# Patient Record
Sex: Male | Born: 1960 | Race: White | Hispanic: No | Marital: Married | Smoking: Never smoker
Health system: Southern US, Community
[De-identification: ages and names within clinical notes are randomized; demographics above are authoritative.]

## PROBLEM LIST (undated history)

## (undated) DIAGNOSIS — E785 Hyperlipidemia, unspecified: Secondary | ICD-10-CM

## (undated) DIAGNOSIS — T7840XA Allergy, unspecified, initial encounter: Secondary | ICD-10-CM

## (undated) HISTORY — DX: Allergy, unspecified, initial encounter: T78.40XA

## (undated) HISTORY — DX: Hyperlipidemia, unspecified: E78.5

## (undated) HISTORY — PX: APPENDECTOMY: SHX54

## (undated) HISTORY — PX: HERNIA REPAIR: SHX51

---

## 2001-04-14 ENCOUNTER — Emergency Department (HOSPITAL_COMMUNITY): Admission: EM | Admit: 2001-04-14 | Discharge: 2001-04-14 | Payer: Self-pay

## 2015-12-08 ENCOUNTER — Ambulatory Visit (INDEPENDENT_AMBULATORY_CARE_PROVIDER_SITE_OTHER): Payer: Worker's Compensation | Admitting: Emergency Medicine

## 2015-12-08 ENCOUNTER — Ambulatory Visit: Payer: Worker's Compensation

## 2015-12-08 VITALS — BP 144/82 | HR 87 | Temp 97.9°F | Resp 16 | Ht 62.0 in | Wt 267.0 lb

## 2015-12-08 DIAGNOSIS — S8001XA Contusion of right knee, initial encounter: Secondary | ICD-10-CM

## 2015-12-08 DIAGNOSIS — S8002XA Contusion of left knee, initial encounter: Secondary | ICD-10-CM | POA: Diagnosis not present

## 2015-12-08 DIAGNOSIS — Z23 Encounter for immunization: Secondary | ICD-10-CM

## 2015-12-08 DIAGNOSIS — S8012XA Contusion of left lower leg, initial encounter: Secondary | ICD-10-CM

## 2015-12-08 DIAGNOSIS — S5002XA Contusion of left elbow, initial encounter: Secondary | ICD-10-CM

## 2015-12-08 DIAGNOSIS — S46912A Strain of unspecified muscle, fascia and tendon at shoulder and upper arm level, left arm, initial encounter: Secondary | ICD-10-CM | POA: Diagnosis not present

## 2015-12-08 NOTE — Progress Notes (Signed)
By signing my name below, I, Mesha Guinyard, attest that this documentation has been prepared under the direction and in the presence of Lesle ChrisSteven Ervan Heber, MD.  Electronically Signed: Arvilla MarketMesha Guinyard, Medical Scribe. 12/08/2015. 11:14 AM.  Chief Complaint:  Chief Complaint  Patient presents with  . Knee Injury    Larey SeatFell at work on 4/18 - pain in both knee   . Shoulder Pain    HPI: Martin Schwartz is a 55 y.o. male who [facility] complaining of w/c injury that resulted in knee and shoulder pain which occurred 3 days ago while doing an instillation on a truck. He tripped and fell on a plastic bedliner, which was sitting on concrete. During the fall, he put most of his weight on his left knee and right elbow. He reports an abrasion on his left ankle, and is a little sore to palpation.   Past Medical History  Diagnosis Date  . Allergy   . Hyperlipidemia    Past Surgical History  Procedure Laterality Date  . Appendectomy    . Hernia repair     Social History   Social History  . Marital Status: Married    Spouse Name: N/A  . Number of Children: N/A  . Years of Education: N/A   Social History Main Topics  . Smoking status: Never Smoker   . Smokeless tobacco: Not on file  . Alcohol Use: 3.6 oz/week    6 Standard drinks or equivalent per week  . Drug Use: No  . Sexual Activity: Not on file   Other Topics Concern  . Not on file   Social History Narrative  . No narrative on file   No family history on file. Allergies  Allergen Reactions  . Bee Venom    Prior to Admission medications   Medication Sig Start Date End Date Taking? Authorizing Provider  lisinopril-hydrochlorothiazide (PRINZIDE,ZESTORETIC) 20-12.5 MG tablet Take 1 tablet by mouth daily.   Yes Historical Provider, MD  pravastatin (PRAVACHOL) 40 MG tablet Take 40 mg by mouth daily.   Yes Historical Provider, MD  sertraline (ZOLOFT) 100 MG tablet Take 100 mg by mouth daily.   Yes Historical Provider, MD      ROS: The patient denies fevers, chills, night sweats, unintentional weight loss, chest pain, palpitations, wheezing, dyspnea on exertion, nausea, vomiting, abdominal pain, dysuria, hematuria, melena, numbness, weakness, or tingling.  All other systems have been reviewed and were otherwise negative with the exception of those mentioned in the HPI and as above.    PHYSICAL EXAM: Filed Vitals:   12/08/15 1105  BP: 144/82  Pulse: 87  Temp: 97.9 F (36.6 C)  Resp: 16   Body mass index is 48.82 kg/(m^2).   General: Alert, no acute distress HEENT:  Normocephalic, atraumatic, oropharynx patent. Eye: Nonie HoyerOMI, [facility] Cardiovascular:  Regular rate and rhythm, no rubs murmurs or gallops.  No Carotid bruits, radial pulse intact. No pedal edema.  Respiratory: Clear to auscultation bilaterally.  No wheezes, rales, or rhonchi.  No cyanosis, no use of accessory musculature Abdominal: No organomegaly, abdomen is soft and non-tender, positive bowel sounds.  No masses. Musculoskeletal: Gait intact. No edema. Unable to fully abduct left arm. Pain with internal and external rotation at the shoulder. Elbow has full flexion and extension without swelling. Left knee is stable without ecchymosis or instability. Abrasion over left lower leg. Full flexion and extension over right knee. No instability. Skin: No rashes. Neurologic: Facial musculature symmetric. Psychiatric: Patient acts appropriately  throughout our interaction. Lymphatic: No cervical or submandibular lymphadenopathy  LABS:   EKG/XRAY:   Primary read interpreted by Dr. Cleta Alberts at [facility].    Dg Tibia/fibula Left  12/08/2015  CLINICAL DATA:  Fall 3 days ago.  Left knee contusion. EXAM: LEFT TIBIA AND FIBULA - 2 VIEW COMPARISON:  None. FINDINGS: There is no evidence of fracture or other focal bone lesions. Soft tissues are unremarkable. IMPRESSION: Negative. Electronically Signed   By: Charlett Nose M.D.   On: 12/08/2015 12:13   Dg Shoulder  Left  12/08/2015  CLINICAL DATA:  Left shoulder injury from fall. EXAM: LEFT SHOULDER - 2+ VIEW COMPARISON:  None. FINDINGS: There is no evidence of fracture or dislocation. There is no evidence of arthropathy or other focal bone abnormality. Soft tissues are unremarkable. IMPRESSION: Negative. Electronically Signed   By: Charlett Nose M.D.   On: 12/08/2015 12:15   Dg Knee Complete 4 Views Left  12/08/2015  CLINICAL DATA:  Knee contusion. Injury 3 days ago. Tripped and fell. EXAM: LEFT KNEE - COMPLETE 4+ VIEW COMPARISON:  Right knee performed today. FINDINGS: There is no evidence of fracture, dislocation, or joint effusion. There is no evidence of arthropathy or other focal bone abnormality. Soft tissues are unremarkable. IMPRESSION: Negative. Electronically Signed   By: Charlett Nose M.D.   On: 12/08/2015 12:12   Dg Knee Complete 4 Views Right  12/08/2015  CLINICAL DATA:  Fall 3 days ago.  Knee contusion. EXAM: RIGHT KNEE - COMPLETE 4+ VIEW COMPARISON:  Left knee performed today FINDINGS: There is no evidence of fracture, dislocation, or joint effusion. There is no evidence of arthropathy or other focal bone abnormality. Soft tissues are unremarkable. IMPRESSION: Negative. Electronically Signed   By: Charlett Nose M.D.   On: 12/08/2015 12:13      ASSESSMENT/PLAN: 1.patient to have limited activity. Advised him he could take some ibuprofen or Aleve for discomfort. Recheck 1 week and be sure her symptoms are resolving. He has had chronic problems with his left shoulder and should probably go ahead and see an orthopedist regarding this.I personally performed the services described in this documentation, which was scribed in my presence. The recorded information has been reviewed and is accurate. I will see the patient back here in one week.   Gross sideeffects, risk and benefits, and alternatives of medications d/w patient. Patient is aware that all medications have potential sideeffects and we are unable  to predict every sideeffect or drug-drug interaction that may occur.  Lesle Chris MD 12/08/2015 11:14 AM

## 2015-12-08 NOTE — Patient Instructions (Addendum)
Please take 1-2 Aleve twice a day with food. Avoid any heavy lifting or straining. Recheck in our office in one week.    IF you received an x-Jarrah today, you will receive an invoice from [facility]. Please contact [facility] at [phone] with questions or concerns regarding your invoice.   IF you received labwork today, you will receive an invoice from [facility]. Please contact [facility] at [phone] with questions or concerns regarding your invoice.   Our billing staff will not be able to assist you with questions regarding bills from these companies.  You will be contacted with the lab results as soon as they are available. The fastest way to get your results is to activate your My Chart account. Instructions are located on the last page of this paperwork. If you have not heard from us regarding the results in 2 weeks, please contact this office.

## 2015-12-16 ENCOUNTER — Ambulatory Visit (INDEPENDENT_AMBULATORY_CARE_PROVIDER_SITE_OTHER): Payer: Worker's Compensation | Admitting: Emergency Medicine

## 2015-12-16 VITALS — BP 130/74 | HR 77 | Temp 98.2°F | Resp 16 | Ht 70.0 in | Wt 266.8 lb

## 2015-12-16 DIAGNOSIS — S46912D Strain of unspecified muscle, fascia and tendon at shoulder and upper arm level, left arm, subsequent encounter: Secondary | ICD-10-CM | POA: Diagnosis not present

## 2015-12-16 MED ORDER — HYDROCODONE-ACETAMINOPHEN 10-325 MG PO TABS: 1.0000 | ORAL_TABLET | Freq: Three times a day (TID) | ORAL | Status: DC | PRN

## 2015-12-16 MED ORDER — MELOXICAM 15 MG PO TABS: 15.0000 mg | ORAL_TABLET | Freq: Every day | ORAL | Status: DC

## 2015-12-16 MED ORDER — HYDROCODONE-ACETAMINOPHEN 5-325 MG PO TABS: ORAL_TABLET | ORAL | Status: AC

## 2015-12-16 MED ORDER — MELOXICAM 15 MG PO TABS: 15.0000 mg | ORAL_TABLET | Freq: Every day | ORAL | Status: AC

## 2015-12-16 NOTE — Patient Instructions (Addendum)
No lifting over 20 pounds. No overhead lifting. No work with arms above shoulder height.    IF you received an x-Daegon today, you will receive an invoice from [facility]. Please contact [facility] at [phone] with questions or concerns regarding your invoice.   IF you received labwork today, you will receive an invoice from [facility]. Please contact [facility] at [phone] with questions or concerns regarding your invoice.   Our billing staff will not be able to assist you with questions regarding bills from these companies.  You will be contacted with the lab results as soon as they are available. The fastest way to get your results is to activate your My Chart account. Instructions are located on the last page of this paperwork. If you have not heard from us regarding the results in 2 weeks, please contact this office.

## 2015-12-16 NOTE — Progress Notes (Signed)
By signing my name below, I, Stann Oresung-Kai Tsai, attest that this documentation has been prepared under the direction and in the presence of Lesle ChrisSteven Sylvain Hasten, MD. Electronically Signed: Stann Oresung-Kai Tsai, Scribe. 12/16/2015 , 10:28 AM .  Patient was seen in room 2 .  Chief Complaint:  Chief Complaint  Patient presents with  . Follow-up  . Knee pain    "better"  . left shoulder    "still Hurting"    HPI: Martin Schwartz is a 55 y.o. male who [facility] for follow up from an injury that occurred at work. Date of injury 12/05/15. He was initially seen on 12/08/15 for left shoulder pain and bilateral knee pain. Xrays of his knees, left shoulder and tibia fibula were negative. He also mentioned having chronic left shoulder pain, and the fall aggravated the left shoulder pain. He was advised to take ibuprofen or Aleve for discomfort. Here for 1 week recheck.   Patient reports knees feeling better but can't kneel down on them.  His shoulder, however, still has constant soreness (rating 7-8/10) with some weakness. He notices this pain even when he's sleeping. He notes picking up something heavy while at work years ago and felt a pop. He's seen an orthopedist in the past; was informed he had inflammation and received cortisone injection without any improvement. He's been taking ibuprofen for the pain.   Allergies  Allergen Reactions  . Bee Venom    Prior to Admission medications   Medication Sig Start Date End Date Taking? Authorizing Provider  lisinopril-hydrochlorothiazide (PRINZIDE,ZESTORETIC) 20-12.5 MG tablet Take 1 tablet by mouth daily.   Yes Historical Provider, MD  pravastatin (PRAVACHOL) 40 MG tablet Take 40 mg by mouth daily.   Yes Historical Provider, MD  sertraline (ZOLOFT) 100 MG tablet Take 100 mg by mouth daily.   Yes Historical Provider, MD     ROS:  Constitutional: negative for fever, chills, night sweats, weight changes, or fatigue  HEENT: negative for vision changes,  hearing loss, congestion, rhinorrhea, ST, epistaxis, or sinus pressure Cardiovascular: negative for chest pain or palpitations Respiratory: negative for hemoptysis, wheezing, shortness of breath, or cough Abdominal: negative for abdominal pain, nausea, vomiting, diarrhea, or constipation Dermatological: negative for rash Musc: positive for left shoulder pain, bilateral knee pain Neurologic: negative for headache, dizziness, or syncope All other systems reviewed and are otherwise negative with the exception to those above and in the HPI.  PHYSICAL EXAM: Filed Vitals:   12/16/15 0957  BP: 130/74  Pulse: 77  Temp: 98.2 F (36.8 C)  Resp: 16   Body mass index is 38.28 kg/(m^2).   General: Alert, no acute distress HEENT:  Normocephalic, atraumatic, oropharynx patent. Eye: Non[facility]OMI[facility] Cardiovascular:  Regular rate and rhythm, no rubs murmurs or gallops.  No Carotid bruits, radial pulse intact. No pedal edema.  Respiratory: Clear to auscultation bilaterally.  No wheezes, rales, or rhonchi.  No cyanosis, no use of accessory musculature Abdominal: No organomegaly, abdomen is soft and non-tender, positive bowel sounds. No masses. Musculoskeletal: Left shoulder, AC joint: tenderness over the left AC joint, pain with abduction past 90 degrees, pain with external rotation against resistance; Knees: no swelling, no tenderness, good rom, no instability Skin: No rashes. Neurologic: Facial musculature symmetric. Psychiatric: Patient acts appropriately throughout our interaction.  Lymphatic: No cervical or submandibular lymphadenopathy Genitourinary/Anorectal: No acute findings  LABS:   EKG/XRAY:     ASSESSMENT/PLAN: Patient has evidence 7 ACC joint problem in the left shoulder. He had  had some shoulder difficulties prior to his fall but it appears the fall has significantly aggravated his shoulder discomfort. I think an opinion from an orthopedist would be indicated. He will be on Mobitz  one in the morning and have hydrocodone at night for pain. He will do some stretching exercises. He will apply heat prior to exercises and after work well, ice to the area.I personally performed the services described in this documentation, which was scribed in my presence. The recorded information has been reviewed and is accurate.   Gross sideeffects, risk and benefits, and alternatives of medications d/w patient. Patient is aware that all medications have potential sideeffects and we are unable to predict every sideeffect or drug-drug interaction that may occur.  Lesle Chris MD 12/16/2015 10:28 AM

## 2016-10-17 IMAGING — CR DG KNEE COMPLETE 4+V*L*
4 series · 4 of 4 positions shown · non-contrast
Comparison: Right knee performed today.

CLINICAL DATA: Knee contusion. Injury 3 days ago. Tripped and fell.

EXAM:
LEFT KNEE - COMPLETE 4+ VIEW

[AP]
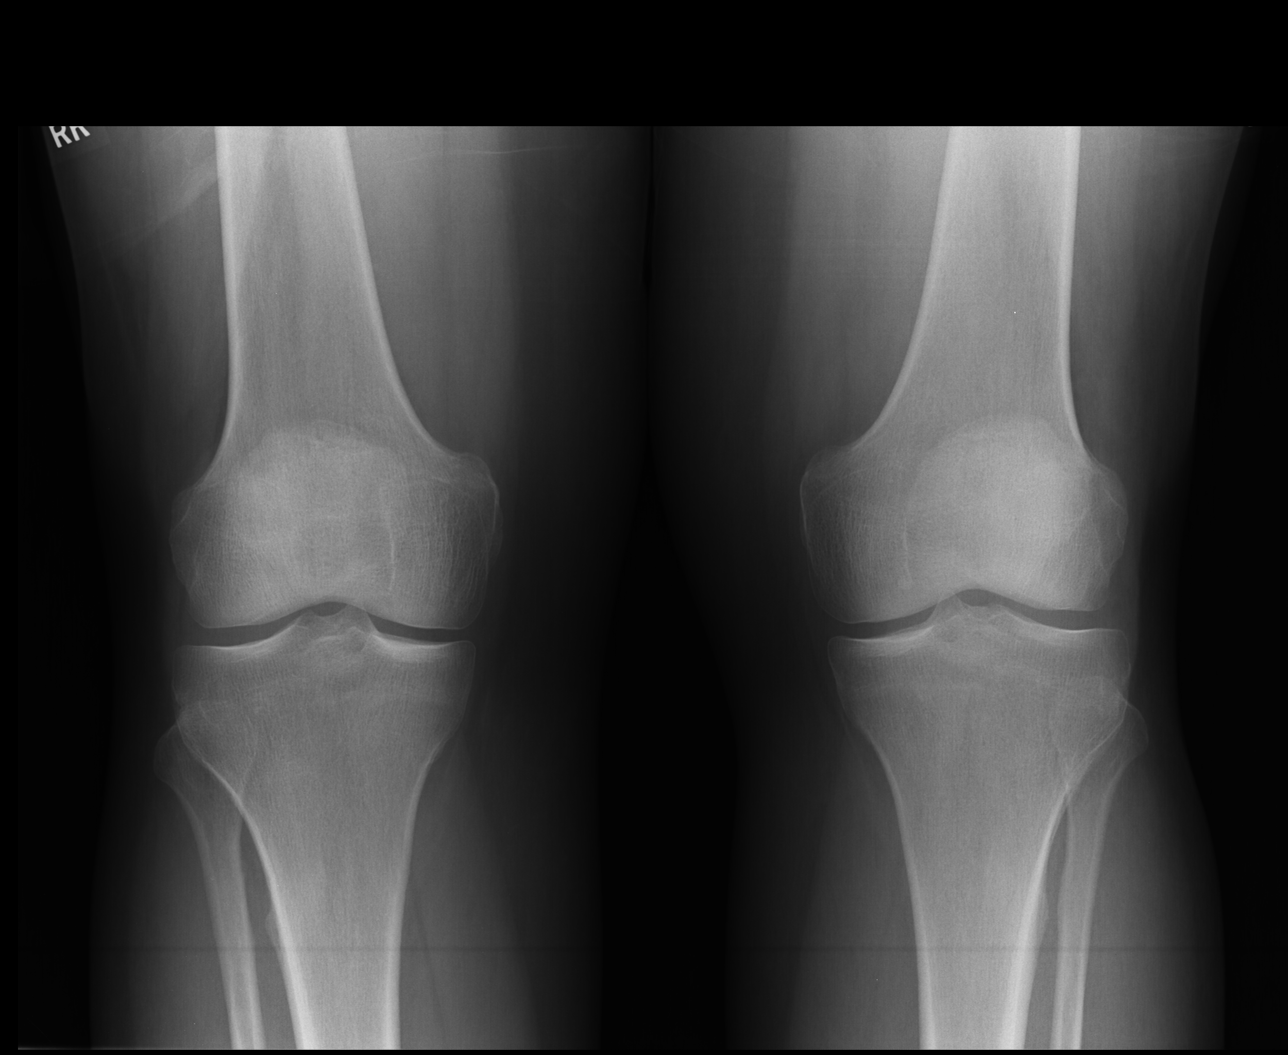

[lateral]
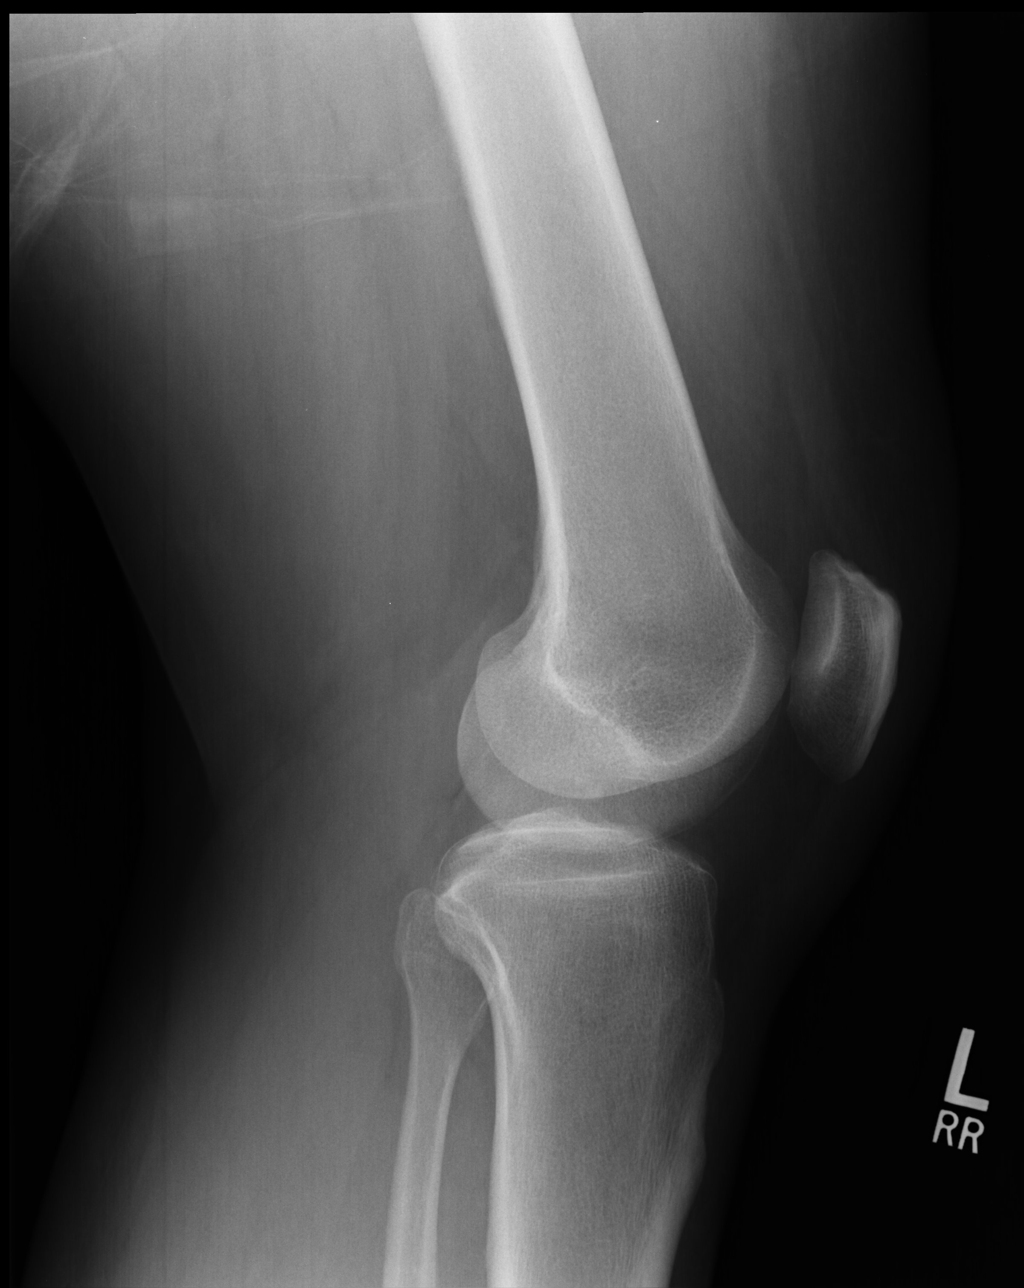

[ap ext rot]
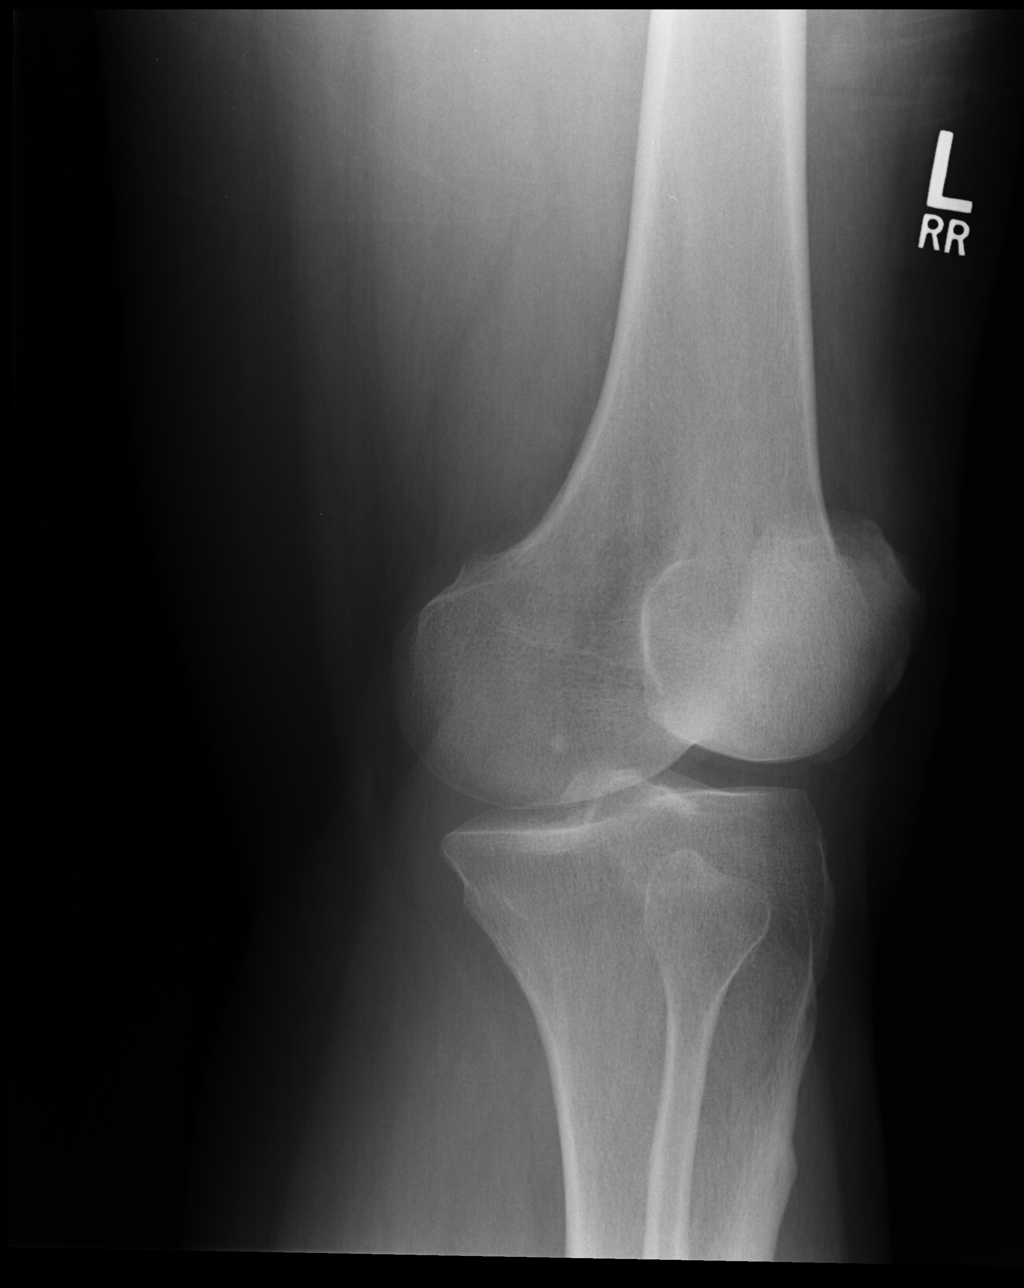

[ap int rot]
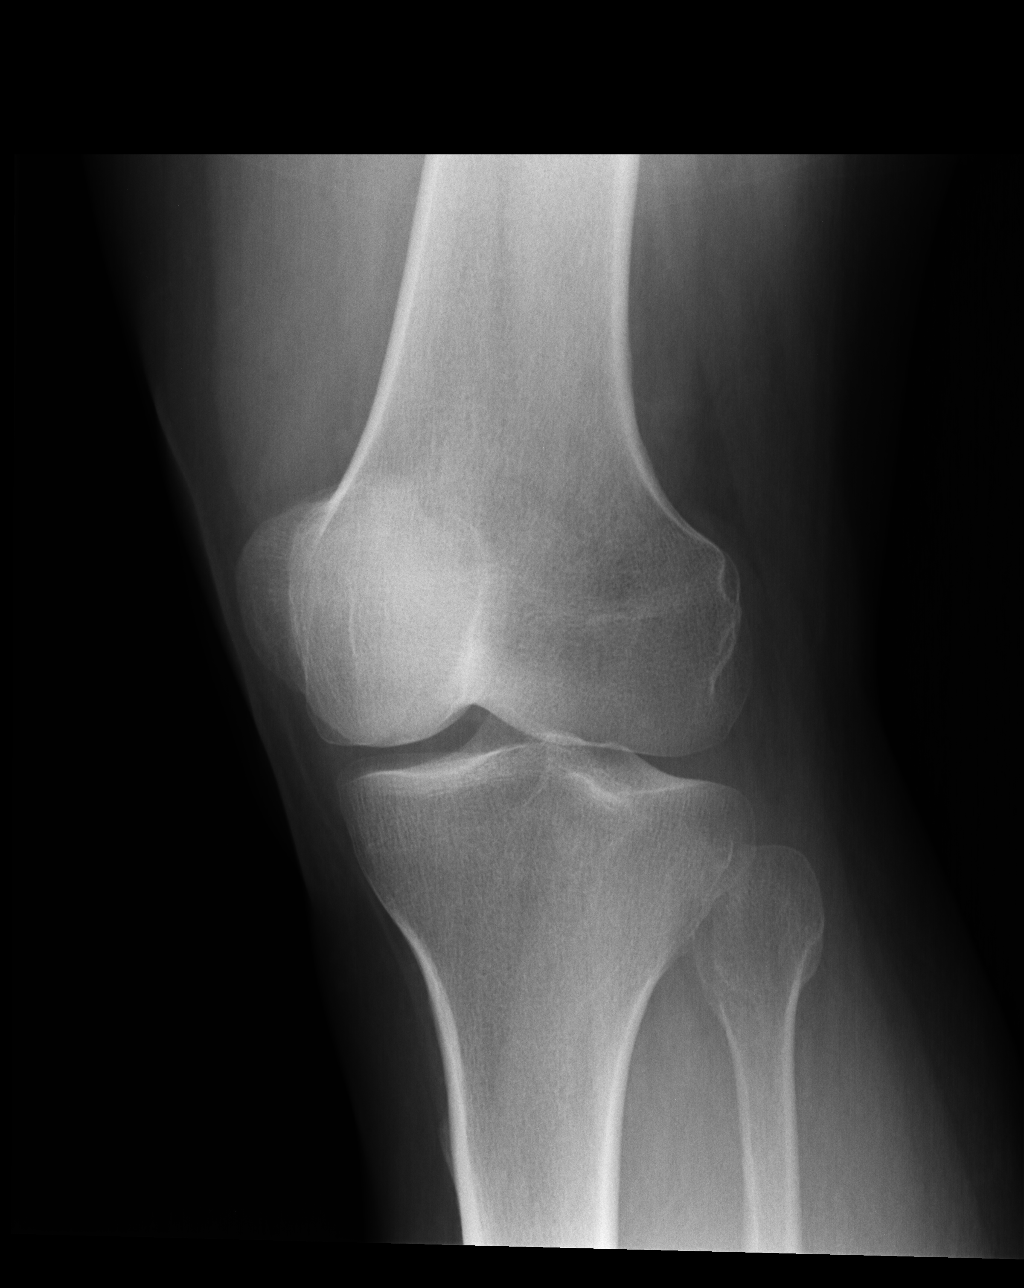

[4 of 4 positions shown; findings below may reference images not displayed]

FINDINGS: There is no evidence of fracture, dislocation, or joint effusion.
There is no evidence of arthropathy or other focal bone abnormality.
Soft tissues are unremarkable.
IMPRESSION: Negative.
